# Patient Record
Sex: Female | Born: 1950 | Race: White | Hispanic: No | Smoking: Current every day smoker
Health system: Southern US, Community
[De-identification: ages and names within clinical notes are randomized; demographics above are authoritative.]

## PROBLEM LIST (undated history)

## (undated) DIAGNOSIS — J432 Centrilobular emphysema: Secondary | ICD-10-CM

## (undated) DIAGNOSIS — J449 Chronic obstructive pulmonary disease, unspecified: Secondary | ICD-10-CM

## (undated) DIAGNOSIS — I2510 Atherosclerotic heart disease of native coronary artery without angina pectoris: Secondary | ICD-10-CM

## (undated) DIAGNOSIS — F17210 Nicotine dependence, cigarettes, uncomplicated: Secondary | ICD-10-CM

## (undated) HISTORY — PX: ABDOMINAL HYSTERECTOMY: SHX81

---

## 2012-06-16 ENCOUNTER — Ambulatory Visit: Payer: Self-pay | Admitting: Internal Medicine

## 2013-02-11 ENCOUNTER — Ambulatory Visit: Payer: Self-pay | Admitting: Family Medicine

## 2013-03-16 ENCOUNTER — Ambulatory Visit: Payer: Self-pay | Admitting: Emergency Medicine

## 2013-07-11 IMAGING — CT CT CHEST W/O CM
1 of 2 series · 14 of 32 positions shown, 18 images · non-contrast
Comparison: none

REASON FOR EXAM: abn chest xray showed nodule at right lung base
COMMENTS:

PROCEDURE:     KCT - KCT CHEST WITHOUT CONTRAST  - June 16, 2012 [DATE]
RESULT:     History: Pulmonary nodule
Comparison Study: Prior recent chest x-ray from [REDACTED].

[Series 2: chest w/o 3.0 i31f 2 · axial · non-contrast · 0.63mm/px · z∈[-625,-346]mm · 14 of 111 slices shown, 18 images]
[im 9/111  mediastinal]
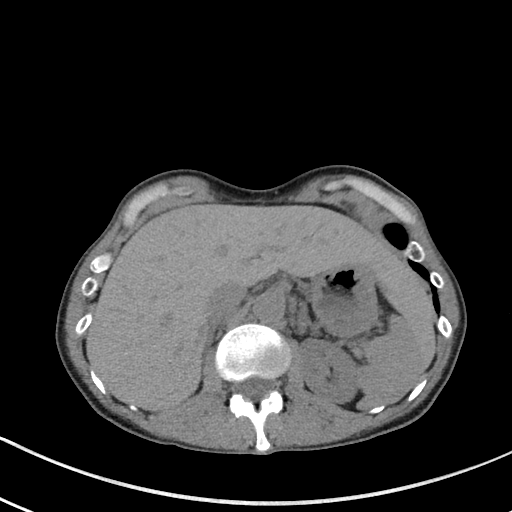
[im 9/111  lung]
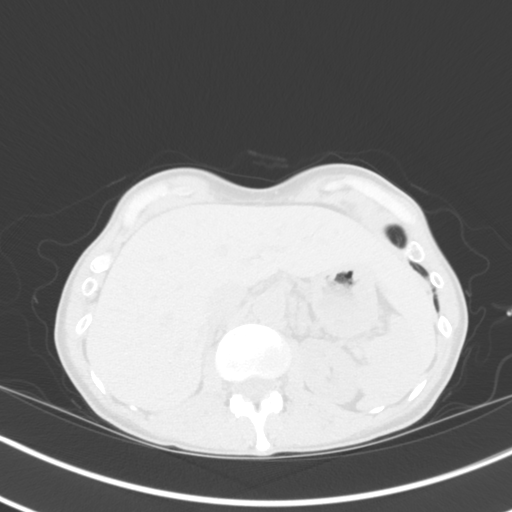
[im 17/111  lung]
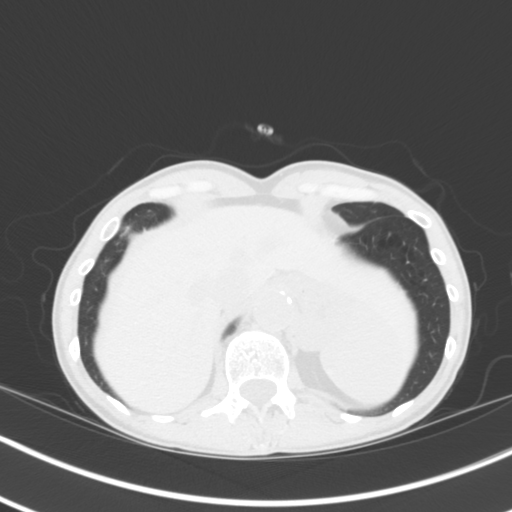
[im 26/111  lung]
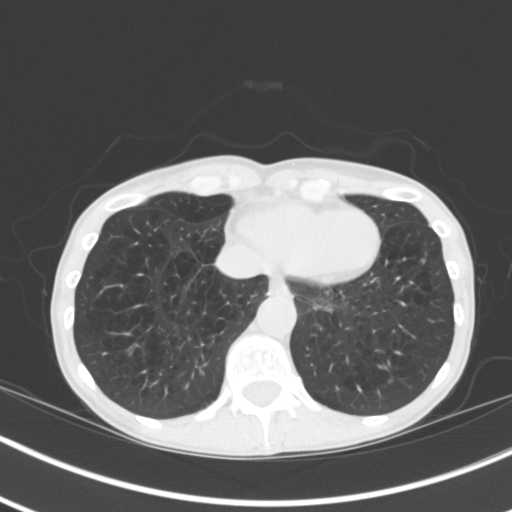
[im 34/111  lung]
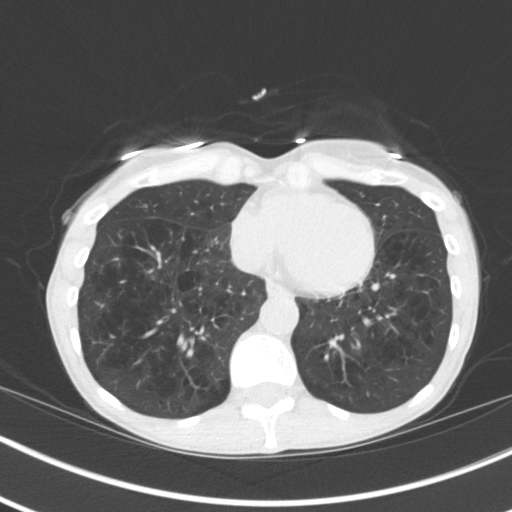
[im 43/111  mediastinal]
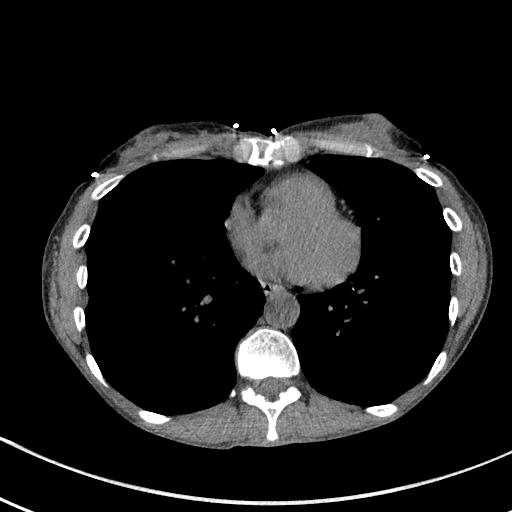
[im 43/111  lung]
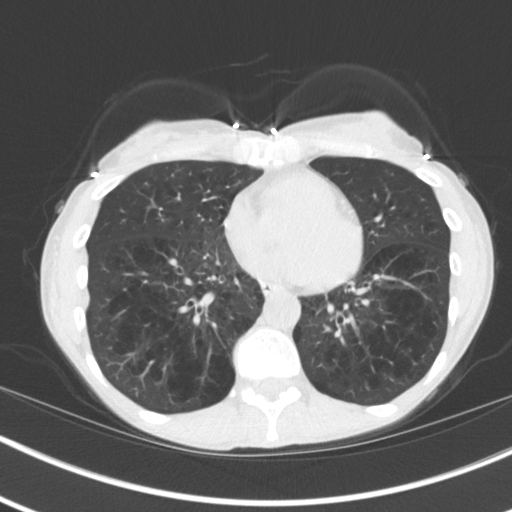
[im 51/111  lung]
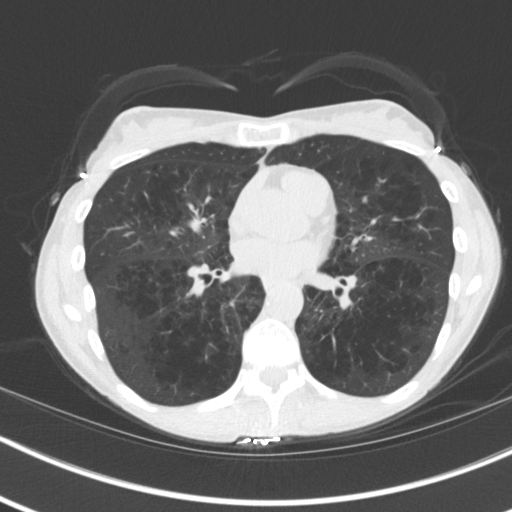
[im 53/111  lung]
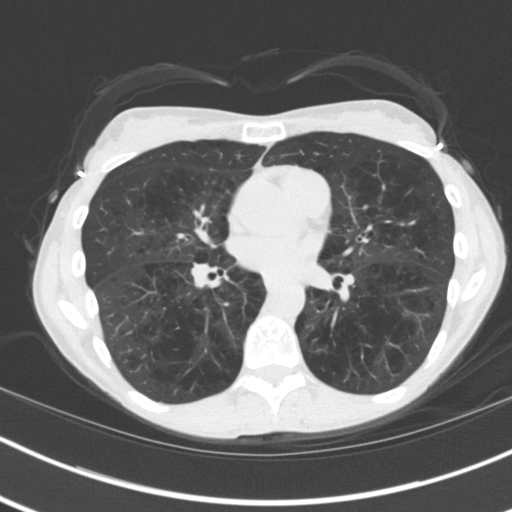
[im 56/111  lung]
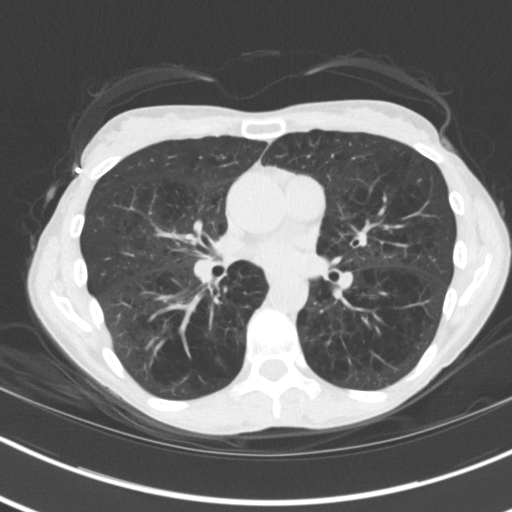
[im 60/111  mediastinal]
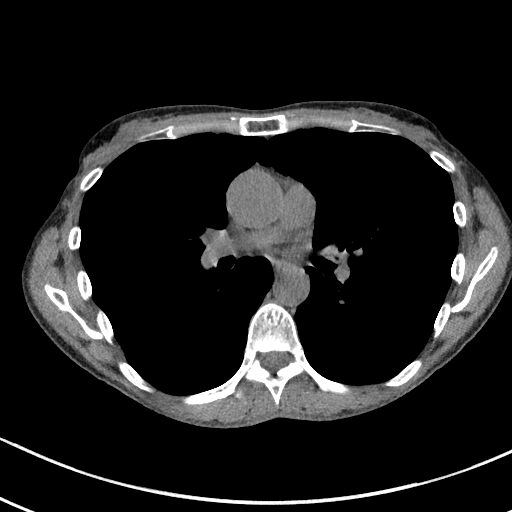
[im 60/111  lung]
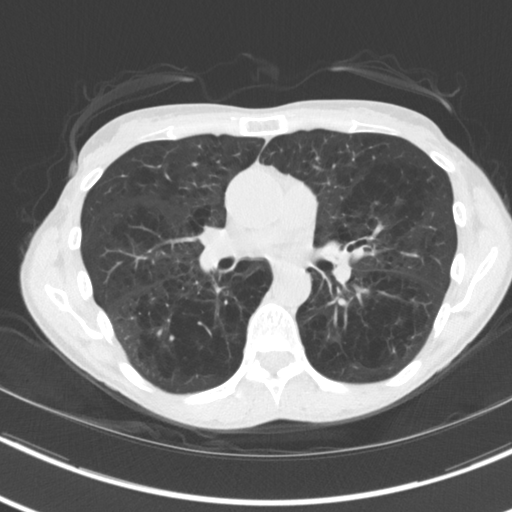
[im 68/111  lung]
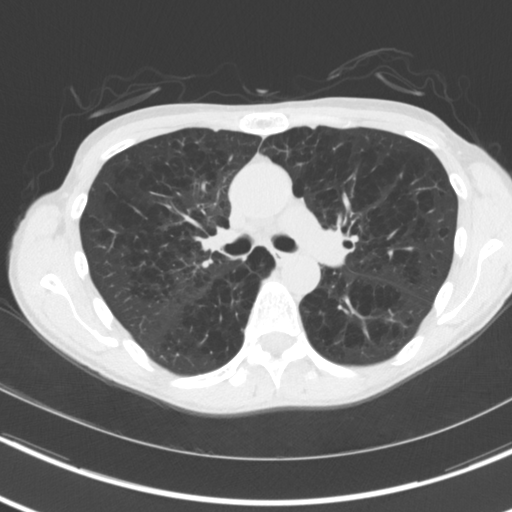
[im 77/111  lung]
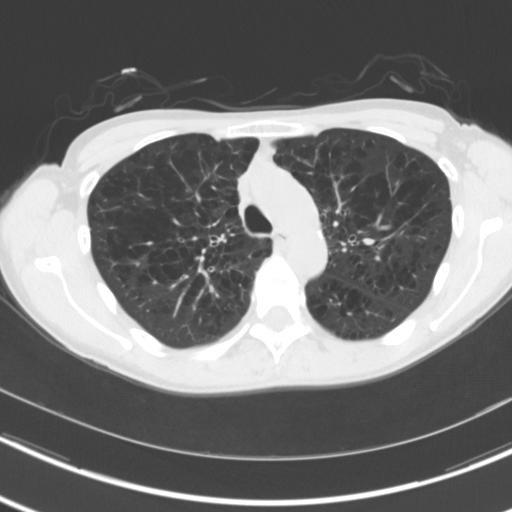
[im 85/111  lung]
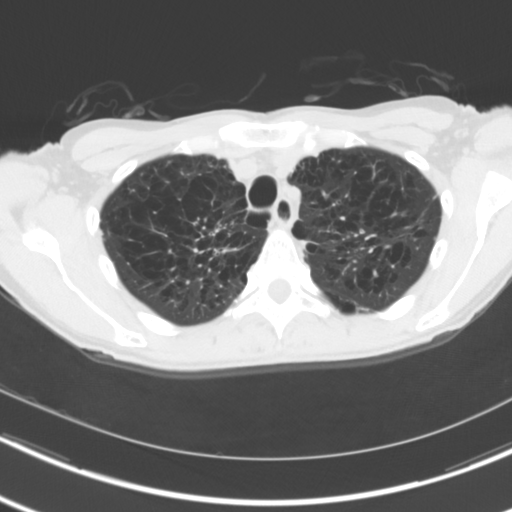
[im 94/111  mediastinal]
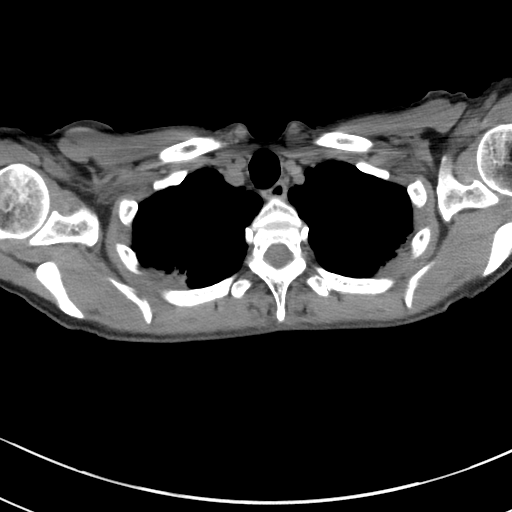
[im 94/111  lung]
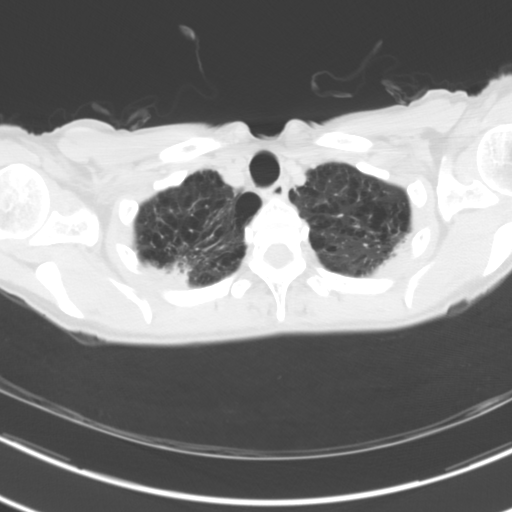
[im 102/111  lung]
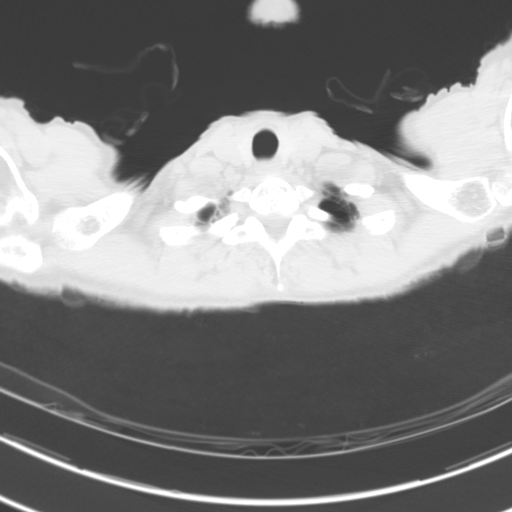

[14 of 32 positions shown; findings below may reference images not displayed]

FINDINGS: Standard nonenhanced CT obtained. Biapical prominent pleural
parenchymal thickening is noted particularly right. These changes could
represent scarring however underlying tumor or infection cannot be excluded.
 This corresponds to the abnormality noted on recent chest x-ray. Local
nonspecific 2 mm nodules are noted in the left lung on lung CAD images.
Mediastinum is unremarkable. Thoracic aorta nondistended. Adrenals are
normal. Changes of bullous COPD noted.
IMPRESSION: 1. Biapical prominent pleural parenchymal thickening right side greater than
left. These changes are most likely related to scarring, however pneumonia
or underlying tumor cannot be excluded.
2. Tiny nonspecific 2 mm nodules in the left lung seen best on lung CAD
images.

 It is suggested that a followup CT be obtained to demonstrate stability.

## 2019-04-25 ENCOUNTER — Other Ambulatory Visit: Payer: Self-pay

## 2019-04-25 ENCOUNTER — Encounter: Payer: Self-pay | Admitting: Emergency Medicine

## 2019-04-25 ENCOUNTER — Ambulatory Visit
Admission: EM | Admit: 2019-04-25 | Discharge: 2019-04-25 | Disposition: A | Discharge status: Home / Self Care | Payer: Managed Care, Other (non HMO)

## 2019-04-25 DIAGNOSIS — M5442 Lumbago with sciatica, left side: Secondary | ICD-10-CM

## 2019-04-25 MED ORDER — PREDNISONE 10 MG PO TABS: ORAL_TABLET | ORAL | 0 refills | Status: DC

## 2019-04-25 NOTE — ED Triage Notes (Signed)
Pt c/o lower back pain and radiates into her left buttock and leg. Started about a week ago but was further up her back. Denies numbness and tingling.

## 2019-04-25 NOTE — Discharge Instructions (Addendum)
Take medication as prescribed. Rest. Drink plenty of fluids. Ice.  ° °Follow up with your primary care physician this week as needed. Return to Urgent care for new or worsening concerns.  ° °
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
## 2020-02-15 LAB — EXTERNAL GENERIC LAB PROCEDURE: COLOGUARD: NEGATIVE

## 2020-02-15 LAB — COLOGUARD: COLOGUARD: NEGATIVE

## 2022-08-28 ENCOUNTER — Ambulatory Visit
Admission: EM | Admit: 2022-08-28 | Discharge: 2022-08-28 | Disposition: A | Discharge status: Home / Self Care | Payer: Medicare HMO | Attending: Family Medicine | Admitting: Family Medicine

## 2022-08-28 DIAGNOSIS — J0140 Acute pansinusitis, unspecified: Secondary | ICD-10-CM

## 2022-08-28 MED ORDER — AZITHROMYCIN 250 MG PO TABS: 250.0000 mg | ORAL_TABLET | Freq: Every day | ORAL | 0 refills | Status: AC

## 2022-08-28 MED ORDER — PREDNISONE 20 MG PO TABS: 40.0000 mg | ORAL_TABLET | Freq: Every day | ORAL | 0 refills | Status: AC

## 2022-08-28 NOTE — ED Triage Notes (Signed)
Pt c/o sinus pain & pressure, pt states she has been dealing with this intermittently since September

## 2022-08-28 NOTE — Discharge Instructions (Addendum)
Stop by the pharmacy to pick up your prescriptions.  Follow up with your primary care provider as needed.  

## 2022-09-01 NOTE — ED Provider Notes (Signed)
[facility]    CSN: [id number] Arrival date & time: 08/28/22  1404      History   Chief Complaint Chief Complaint  Patient presents with   Nasal Congestion    HPI Jodi English is a 71 y.o. female.   HPI   Jodi English presents for ongoing intermittent sinus pressure and pain since September but more recently has worsened in the last week or two.  She has not had a fever but has felt warm.  She has tried over-the-counter medications without relief.  She has history of smoking.  Has a slight cough.  That she believes is related to her ongoing nasal congestion.      History reviewed. No pertinent past medical history.  There are no problems to display for this patient.   Past Surgical History:  Procedure Laterality Date   ABDOMINAL HYSTERECTOMY      OB History   No obstetric history on file.      Home Medications    Prior to Admission medications   Medication Sig Start Date End Date Taking? Authorizing Provider  alendronate (FOSAMAX) 70 MG tablet TAKE 1 TABLET ONE TIME A WEEK AS DIRECTED 07/21/22  Yes [provider]  atorvastatin (LIPITOR) 10 MG tablet Take 1 tablet by mouth daily. 07/10/21  Yes [provider]  azithromycin (ZITHROMAX Z-PAK) 250 MG tablet Take 1 tablet (250 mg total) by mouth daily. Take 2 tablets on day 1 08/28/22  Yes Dhillon Comunale, DO  Calcium Carbonate-Vitamin D 600-400 MG-UNIT tablet Take by mouth. 08/01/06  Yes [provider]  Cholecalciferol 25 MCG (1000 UT) capsule Take by mouth. 10/01/09  Yes [provider]  cyanocobalamin (,VITAMIN B-12,) 1000 MCG/ML injection Inject into the muscle. 08/30/18  Yes [provider]  Fluticasone-Umeclidin-Vilant (TRELEGY ELLIPTA) 100-62.5-25 MCG/ACT AEPB Inhale into the lungs. 06/25/22  Yes [provider]  ibuprofen (ADVIL) 200 MG tablet Take by mouth. 10/01/09  Yes [provider]  predniSONE (DELTASONE) 20 MG tablet Take 2 tablets (40  mg total) by mouth daily for 5 days. 08/28/22 09/02/22 Yes Deshundra Waller, Seward Meth, DO  metaxalone (SKELAXIN) 800 MG tablet Take by mouth. 08/30/18   [provider]    Family History History reviewed. No pertinent family history.  Social History Social History   Tobacco Use   Smoking status: Every Day    Packs/day: 0.50    Types: Cigarettes   Smokeless tobacco: Never  Vaping Use   Vaping Use: Never used  Substance Use Topics   Alcohol use: Not Currently   Drug use: Not Currently     Allergies   Amoxicillin-pot clavulanate, Gabapentin, and Lisinopril   Review of Systems Review of Systems: negative unless otherwise stated in HPI.      Physical Exam Triage Vital Signs ED Triage Vitals  Enc Vitals Group     BP 08/28/22 1529 133/76     Pulse Rate 08/28/22 1529 72     Resp --      Temp 08/28/22 1529 98 F (36.7 C)     Temp Source 08/28/22 1529 Oral     SpO2 08/28/22 1529 98 %     Weight 08/28/22 1527 98 lb (44.5 kg)     Height 08/28/22 1527 5\' 5"  (1.651 m)     Head Circumference --      Peak Flow --      Pain Score 08/28/22 1527 5     Pain Loc --      Pain Edu? --  Excl. in [place]? --    No data found.  Updated Vital Signs BP 133/76 (BP Location: Left Arm)   Pulse 72   Temp 98 F (36.7 C) (Oral)   Ht 5\' 5"  (1.651 m)   Wt 44.5 kg   SpO2 98%   BMI 16.31 kg/m   Visual Acuity Right Eye Distance:   Left Eye Distance:   Bilateral Distance:    Right Eye Near:   Left Eye Near:    Bilateral Near:     Physical Exam GEN:     alert, non-toxic appearing female in no distress    HENT:  mucus membranes moist, oropharyngeal without lesions or exudate, no tonsillar hypertrophy or erythema, moderate erythematous edematous turbinates, clear nasal discharge, bilateral maxillary and frontal sinus tenderness to palpation right worse than left EYES:   pupils equal and reactive, no scleral injection or discharge NECK:  normal ROM, no lymphadenopathy, no meningismus    RESP:  no increased work of breathing CVS:   regular rate  Skin:   warm and dry, no rash on visible skin    UC Treatments / Results  Labs (all labs ordered are listed, but only abnormal results are displayed) Labs Reviewed - No data to display  EKG   Radiology No results found.  Procedures Procedures (including critical care time)  Medications Ordered in UC Medications - No data to display  Initial Impression / Assessment and Plan / UC Course  I have reviewed the triage vital signs and the nursing notes.  Pertinent labs & imaging results that were available during my care of the patient were reviewed by me and considered in my medical decision making (see chart for details).      Pt is a 71 y.o. female who presents for ongoing sinus pressure and congestion for the past few months however has worsened in the last week.  Jodi English is  afebrile here without recent antipyretics.  Vital signs stable.  Overall pt is  non-toxic appearing, well hydrated, without respiratory distress.  On exam, she has tenderness along the frontal and maxillary sinuses bilaterally.  COVID  and influenza testing deferred due to length of symptoms. Treat  pansinusitis with steroids and antibiotics as below. Typical duration of symptoms discussed. Return and ED precautions given and patient voiced understanding.  She is to follow-up with ENT if this is not improving after antibiotics.  Discussed MDM, treatment plan and plan for follow-up with patient who agrees with plan.  .     Final Clinical Impressions(s) / UC Diagnoses   Final diagnoses:  Acute non-recurrent pansinusitis     Discharge Instructions      Stop by the pharmacy to pick up your prescriptions.  Follow up with your primary care provider as needed.     ED Prescriptions     Medication Sig Dispense Auth. Provider   azithromycin (ZITHROMAX Z-PAK) 250 MG tablet Take 1 tablet (250 mg total) by mouth daily. Take 2 tablets on day 1 6  tablet Nieve Rojero, DO   predniSONE (DELTASONE) 20 MG tablet Take 2 tablets (40 mg total) by mouth daily for 5 days. 10 tablet Carney Bern, DO      PDMP not reviewed this encounter.   Katha Cabal, DO 09/01/22 1938

## 2023-06-03 ENCOUNTER — Other Ambulatory Visit: Payer: Self-pay | Admitting: Student

## 2023-06-03 DIAGNOSIS — D3705 Neoplasm of uncertain behavior of pharynx: Secondary | ICD-10-CM

## 2023-06-07 ENCOUNTER — Ambulatory Visit
Admission: RE | Admit: 2023-06-07 | Discharge: 2023-06-07 | Disposition: A | Discharge status: Home / Self Care | Payer: Medicare HMO | Source: Ambulatory Visit | Attending: Student | Admitting: Student

## 2023-06-07 DIAGNOSIS — D3705 Neoplasm of uncertain behavior of pharynx: Secondary | ICD-10-CM

## 2023-06-07 MED ORDER — IOPAMIDOL (ISOVUE-300) INJECTION 61%
75.0000 mL | Freq: Once | INTRAVENOUS | Status: AC | PRN
  Administered 2023-06-07: 75 mL via INTRAVENOUS

## 2024-05-17 ENCOUNTER — Other Ambulatory Visit: Payer: Self-pay

## 2024-05-17 ENCOUNTER — Encounter: Payer: Self-pay | Admitting: Ophthalmology

## 2024-05-18 ENCOUNTER — Encounter: Payer: Self-pay | Admitting: Ophthalmology

## 2024-05-18 NOTE — Anesthesia Preprocedure Evaluation (Addendum)
 Anesthesia Evaluation  Patient identified by MRN, date of birth, ID band Patient awake    Reviewed: Allergy & Precautions, H&P , NPO status , Patient's Chart, lab work & pertinent test results  Airway Mallampati: II  TM Distance: >3 FB Neck ROM: Full    Dental no notable dental hx. (+) Chipped   Pulmonary neg pulmonary ROS, COPD, Current Smoker and Patient abstained from smoking.  Breathing is deep, appears to have increased work of breathing, but patient states she is fine and I just do that sometimes. Breath sounds are clear, and patient does not think she needs a breathing treatment. Is not on oxygen at home.  Pulmonary exam normal breath sounds clear to auscultation       Cardiovascular + CAD  negative cardio ROS Normal cardiovascular exam Rhythm:Regular Rate:Normal     Neuro/Psych  PSYCHIATRIC DISORDERS      negative neurological ROS  negative psych ROS   GI/Hepatic negative GI ROS, Neg liver ROS,,,  Endo/Other  negative endocrine ROS    Renal/GU negative Renal ROS  negative genitourinary   Musculoskeletal negative musculoskeletal ROS (+)    Abdominal   Peds negative pediatric ROS (+)  Hematology negative hematology ROS (+)   Anesthesia Other Findings COPD (chronic obstructive pulmonary disease) (HCC)  CAD (coronary artery disease) COPD, severe (HCC)  Cigarette nicotine dependence Centrilobular emphysema (HCC)     Reproductive/Obstetrics negative OB ROS                              Anesthesia Physical Anesthesia Plan  ASA: 3  Anesthesia Plan: MAC   Post-op Pain Management:    Induction: Intravenous  PONV Risk Score and Plan:   Airway Management Planned: Natural Airway and Nasal Cannula  Additional Equipment:   Intra-op Plan:   Post-operative Plan:   Informed Consent: I have reviewed the patients History and Physical, chart, labs and discussed the procedure  including the risks, benefits and alternatives for the proposed anesthesia with the patient or authorized representative who has indicated his/her understanding and acceptance.     Dental Advisory Given  Plan Discussed with: Anesthesiologist, CRNA and Surgeon  Anesthesia Plan Comments: (Patient consented for risks of anesthesia including but not limited to:  - adverse reactions to medications - damage to eyes, teeth, lips or other oral mucosa - nerve damage due to positioning  - sore throat or hoarseness - Damage to heart, brain, nerves, lungs, other parts of body or loss of life  Patient voiced understanding and assent.)        Anesthesia Quick Evaluation

## 2024-05-24 NOTE — Discharge Instructions (Signed)

## 2024-05-28 ENCOUNTER — Ambulatory Visit: Payer: Self-pay | Admitting: Anesthesiology

## 2024-05-28 ENCOUNTER — Ambulatory Visit
Admission: RE | Admit: 2024-05-28 | Discharge: 2024-05-28 | Disposition: A | Discharge status: Home / Self Care | Attending: Ophthalmology | Admitting: Ophthalmology

## 2024-05-28 ENCOUNTER — Encounter
Admission: RE | Disposition: A | Discharge status: Home / Self Care | Payer: Self-pay | Source: Home / Self Care | Attending: Ophthalmology

## 2024-05-28 ENCOUNTER — Encounter: Payer: Self-pay | Admitting: Ophthalmology

## 2024-05-28 ENCOUNTER — Other Ambulatory Visit: Payer: Self-pay

## 2024-05-28 DIAGNOSIS — H25042 Posterior subcapsular polar age-related cataract, left eye: Secondary | ICD-10-CM | POA: Diagnosis present

## 2024-05-28 DIAGNOSIS — H2512 Age-related nuclear cataract, left eye: Secondary | ICD-10-CM | POA: Insufficient documentation

## 2024-05-28 DIAGNOSIS — F17210 Nicotine dependence, cigarettes, uncomplicated: Secondary | ICD-10-CM | POA: Insufficient documentation

## 2024-05-28 HISTORY — DX: Atherosclerotic heart disease of native coronary artery without angina pectoris: I25.10

## 2024-05-28 HISTORY — DX: Centrilobular emphysema: J43.2

## 2024-05-28 HISTORY — DX: Chronic obstructive pulmonary disease, unspecified: J44.9

## 2024-05-28 HISTORY — DX: Nicotine dependence, cigarettes, uncomplicated: F17.210

## 2024-05-28 HISTORY — PX: CATARACT EXTRACTION W/PHACO: SHX586

## 2024-05-28 SURGERY — PHACOEMULSIFICATION, CATARACT, WITH IOL INSERTION
Anesthesia: Monitor Anesthesia Care | Laterality: Left

## 2024-05-28 MED ORDER — TETRACAINE HCL 0.5 % OP SOLN
1.0000 [drp] | OPHTHALMIC | Status: DC | PRN
  Administered 2024-05-28 (×3): 1 [drp] via OPHTHALMIC

## 2024-05-28 MED ORDER — LIDOCAINE HCL (PF) 2 % IJ SOLN
INTRAOCULAR | Status: DC | PRN
  Administered 2024-05-28: 1 mL via INTRAOCULAR

## 2024-05-28 MED ORDER — ARMC OPHTHALMIC DILATING DROPS
OPHTHALMIC | Status: AC
Start: 2024-05-28 — End: 2024-05-28
  Filled 2024-05-28: qty 0.5

## 2024-05-28 MED ORDER — ARMC OPHTHALMIC DILATING DROPS
1.0000 | OPHTHALMIC | Status: DC | PRN
  Administered 2024-05-28 (×3): 1 via OPHTHALMIC

## 2024-05-28 MED ORDER — MIDAZOLAM HCL 2 MG/2ML IJ SOLN
INTRAMUSCULAR | Status: AC
  Filled 2024-05-28: qty 2

## 2024-05-28 MED ORDER — SIGHTPATH DOSE#1 BSS IO SOLN
INTRAOCULAR | Status: DC | PRN
  Administered 2024-05-28: 15 mL via INTRAOCULAR

## 2024-05-28 MED ORDER — FENTANYL CITRATE (PF) 100 MCG/2ML IJ SOLN
INTRAMUSCULAR | Status: AC
  Filled 2024-05-28: qty 2

## 2024-05-28 MED ORDER — MOXIFLOXACIN HCL 0.5 % OP SOLN
OPHTHALMIC | Status: DC | PRN
  Administered 2024-05-28: .2 mL via OPHTHALMIC

## 2024-05-28 MED ORDER — SIGHTPATH DOSE#1 NA HYALUR & NA CHOND-NA HYALUR IO KIT
PACK | INTRAOCULAR | Status: DC | PRN
  Administered 2024-05-28: 1 via OPHTHALMIC

## 2024-05-28 MED ORDER — SIGHTPATH DOSE#1 BSS IO SOLN
INTRAOCULAR | Status: DC | PRN
  Administered 2024-05-28: 81 mL via OPHTHALMIC

## 2024-05-28 MED ORDER — LACTATED RINGERS IV SOLN: INTRAVENOUS | Status: DC

## 2024-05-28 MED ORDER — MIDAZOLAM HCL 2 MG/2ML IJ SOLN
INTRAMUSCULAR | Status: DC | PRN
  Administered 2024-05-28: 1 mg via INTRAVENOUS

## 2024-05-28 MED ORDER — FENTANYL CITRATE (PF) 100 MCG/2ML IJ SOLN
INTRAMUSCULAR | Status: DC | PRN
  Administered 2024-05-28: 50 ug via INTRAVENOUS

## 2024-05-28 MED ORDER — TETRACAINE HCL 0.5 % OP SOLN
OPHTHALMIC | Status: AC
  Filled 2024-05-28: qty 4

## 2024-05-28 SURGICAL SUPPLY — 9 items
DISSECTOR HYDRO NUCLEUS 50X22 (MISCELLANEOUS) ×1 IMPLANT
FEE CATARACT SUITE SIGHTPATH (MISCELLANEOUS) ×1 IMPLANT
GLOVE PI ULTRA LF STRL 7.5 (GLOVE) ×1 IMPLANT
GLOVE SURG SYN 6.5 PF PI BL (GLOVE) ×1 IMPLANT
GLOVE SURG SYN 8.5 PF PI BL (GLOVE) ×1 IMPLANT
LENS IOL TECNIS EYHANCE 21.0 (Intraocular Lens) IMPLANT
NDL FILTER BLUNT 18X1 1/2 (NEEDLE) ×1 IMPLANT
NEEDLE FILTER BLUNT 18X1 1/2 (NEEDLE) ×1 IMPLANT
SYR 3ML LL SCALE MARK (SYRINGE) ×1 IMPLANT

## 2024-05-28 NOTE — Anesthesia Postprocedure Evaluation (Signed)
 Anesthesia Post Note  Patient: Jodi English  Procedure(s) Performed: PHACOEMULSIFICATION, CATARACT, WITH IOL INSERTION 3.24, 00:28.3 (Left)  Patient location during evaluation: PACU Anesthesia Type: MAC Level of consciousness: awake and alert Pain management: pain level controlled Vital Signs Assessment: post-procedure vital signs reviewed and stable Respiratory status: spontaneous breathing, nonlabored ventilation, respiratory function stable and patient connected to nasal cannula oxygen Cardiovascular status: stable and blood pressure returned to baseline Postop Assessment: no apparent nausea or vomiting Anesthetic complications: no   No notable events documented.   Last Vitals:  Vitals:   05/28/24 0958 05/28/24 1001  BP: (!) 110/59 (!) 120/57  Pulse: 75 61  Resp: 16 19  Temp: (!) 36.1 C (!) 36.1 C  SpO2: 98% 98%    Last Pain:  Vitals:   05/28/24 1001  PainSc: 0-No pain                 [name]

## 2024-05-28 NOTE — Op Note (Signed)
 OPERATIVE NOTE  Jodi English [id number] 05/28/2024   PREOPERATIVE DIAGNOSIS:  Nuclear sclerotic cataract left eye.  H25.12   POSTOPERATIVE DIAGNOSIS:    Nuclear sclerotic cataract left eye.     PROCEDURE:  Phacoemusification with posterior chamber intraocular lens placement of the left eye   LENS:   Implant Name Type Inv. Item Serial No. Manufacturer Lot No. LRB No. Used Action  LENS IOL TECNIS EYHANCE 21.0 - [id number] Intraocular Lens LENS IOL TECNIS EYHANCE 21.0 [id number] [employer]  Left 1 Implanted      Procedure(s): PHACOEMULSIFICATION, CATARACT, WITH IOL INSERTION 3.24, 00:28.3 (Left)  SURGEON:  Adine Novak, MD, MPH   ANESTHESIA:  Topical with tetracaine  drops augmented with 1% preservative-free intracameral lidocaine .  ESTIMATED BLOOD LOSS: <1 mL   COMPLICATIONS:  None.   DESCRIPTION OF PROCEDURE:  The patient was identified in the holding room and transported to the operating room and placed in the supine position under the operating microscope.  The left eye was identified as the operative eye and it was prepped and draped in the usual sterile ophthalmic fashion.   A 1.0 millimeter clear-corneal paracentesis was made at the 5:00 position. 0.5 ml of preservative-free 1% lidocaine  with epinephrine  was injected into the anterior chamber.  The anterior chamber was filled with viscoelastic.  A 2.4 millimeter keratome was used to make a near-clear corneal incision at the 2:00 position.  A curvilinear capsulorrhexis was made with a cystotome and capsulorrhexis forceps.  Balanced salt solution was used to hydrodissect and hydrodelineate the nucleus.   Phacoemulsification was then used in stop and chop fashion to remove the lens nucleus and epinucleus.  The remaining cortex was then removed using the irrigation and aspiration handpiece. Viscoelastic was then placed into the capsular bag to distend it for lens placement.  A lens was then injected into the capsular bag.  The remaining  viscoelastic was aspirated.   Wounds were hydrated with balanced salt solution.  The anterior chamber was inflated to a physiologic pressure with balanced salt solution.  Intracameral vigamox  0.1 mL undiltued was injected into the eye and a drop placed onto the ocular surface.  No wound leaks were noted.  The patient was taken to the recovery room in stable condition without complications of anesthesia or surgery  Adine Novak 05/28/2024, 9:55 AM

## 2024-05-28 NOTE — H&P (Signed)
 [facility]   Primary Care Physician:  Lauran Hails Primary Care Ophthalmologist: Dr. Adine Novak  Pre-Procedure History & Physical: HPI:  Jodi English is a 73 y.o. female here for cataract surgery.   Past Medical History:  Diagnosis Date   CAD (coronary artery disease)    Centrilobular emphysema (HCC)    Cigarette nicotine dependence    COPD (chronic obstructive pulmonary disease) (HCC)    COPD, severe (HCC)     Past Surgical History:  Procedure Laterality Date   ABDOMINAL HYSTERECTOMY      Prior to Admission medications   Medication Sig Start Date End Date Taking? Authorizing Provider  acetaminophen (TYLENOL) 325 MG tablet Take 650 mg by mouth every 6 (six) hours as needed.   Yes [provider]  alendronate (FOSAMAX) 70 MG tablet TAKE 1 TABLET ONE TIME A WEEK AS DIRECTED 07/21/22  Yes [provider]  atorvastatin (LIPITOR) 10 MG tablet Take 1 tablet by mouth daily. 07/10/21  Yes [provider]  azithromycin  (ZITHROMAX  Z-PAK) 250 MG tablet Take 1 tablet (250 mg total) by mouth daily. Take 2 tablets on day 1 08/28/22  Yes Brimage, Vondra, DO  Calcium Carbonate-Vitamin D 600-400 MG-UNIT tablet Take by mouth. 08/01/06  Yes [provider]  cetirizine (ZYRTEC) 10 MG tablet Take 10 mg by mouth daily.   Yes [provider]  cyanocobalamin (,VITAMIN B-12,) 1000 MCG/ML injection Inject into the muscle. 08/30/18  Yes [provider]  Fluticasone-Salmeterol (WIXELA INHUB IN) Inhale into the lungs.   Yes [provider]  ibuprofen (ADVIL) 200 MG tablet Take by mouth. 10/01/09  Yes [provider]  metaxalone (SKELAXIN) 800 MG tablet Take by mouth. 08/30/18  Yes [provider]  Multiple Vitamin (MULTIVITAMIN) LIQD Take 5 mLs by mouth daily.   Yes [provider]  Omega-3 Fatty Acids (FISH OIL) 300 MG CAPS Take by mouth.   Yes [provider]  Cholecalciferol 25 MCG (1000 UT) capsule  Take by mouth. Patient not taking: Reported on 05/17/2024 10/01/09   [provider]  Fluticasone-Umeclidin-Vilant (TRELEGY ELLIPTA) 100-62.5-25 MCG/ACT AEPB Inhale into the lungs. Patient not taking: Reported on 05/17/2024 06/25/22   [provider]    Allergies as of 05/08/2024 - Review Complete 08/28/2022  Allergen Reaction Noted   Amoxicillin-pot clavulanate Other (See Comments) 06/04/2013   Gabapentin  04/25/2019   Lisinopril Nausea And Vomiting 12/21/2021    History reviewed. No pertinent family history.  Social History   Socioeconomic History   Marital status: Widowed    Spouse name: Not on file   Number of children: Not on file   Years of education: Not on file   Highest education level: Not on file  Occupational History   Not on file  Tobacco Use   Smoking status: Every Day    Current packs/day: 0.50    Types: Cigarettes   Smokeless tobacco: Never  Vaping Use   Vaping status: Never Used  Substance and Sexual Activity   Alcohol use: Not Currently   Drug use: Not Currently   Sexual activity: Not on file  Other Topics Concern   Not on file  Social History Narrative   Not on file   Social Drivers of Health   Financial Resource Strain: Low Risk  (05/27/2024)   Received from [facility]   Overall Financial Resource Strain (CARDIA)    Difficulty of Paying Living Expenses: Not hard at all  Food Insecurity: No Food Insecurity (05/27/2024)  Received from [facility]   Hunger Vital Sign    Within the past 12 months, you worried that your food would run out before you got the money to buy more.: Never true    Within the past 12 months, the food you bought just didn't last and you didn't have money to get more.: Never true  Transportation Needs: No Transportation Needs (05/27/2024)   Received from [facility] - Transportation    In the past 12 months, has lack of transportation kept you from  medical appointments or from getting medications?: No    Lack of Transportation (Non-Medical): No  Physical Activity: Not on file  Stress: Not on file  Social Connections: Not on file  Intimate Partner Violence: Not on file    Review of Systems: See HPI, otherwise negative ROS  Physical Exam: Ht 5' 5 (1.651 m)   Wt 45.4 kg   BMI 16.64 kg/m  General:   Alert, cooperative. Head:  Normocephalic and atraumatic. Respiratory:  Normal work of breathing. Cardiovascular:  NAD  Impression/Plan: Jodi English is here for cataract surgery.  Risks, benefits, limitations, and alternatives regarding cataract surgery have been reviewed with the patient.  Questions have been answered.  All parties agreeable.   Adine Novak, MD  05/28/2024, 9:27 AM

## 2024-05-28 NOTE — Transfer of Care (Signed)
 Immediate Anesthesia Transfer of Care Note  Patient: Jodi English  Procedure(s) Performed: PHACOEMULSIFICATION, CATARACT, WITH IOL INSERTION 3.24, 00:28.3 (Left)  Patient Location: PACU  Anesthesia Type: MAC  Level of Consciousness: awake, alert  and patient cooperative  Airway and Oxygen Therapy: Patient Spontanous Breathing and Patient connected to supplemental oxygen  Post-op Assessment: Post-op Vital signs reviewed, Patient's Cardiovascular Status Stable, Respiratory Function Stable, Patent Airway and No signs of Nausea or vomiting  Post-op Vital Signs: Reviewed and stable  Complications: No notable events documented.

## 2024-05-31 ENCOUNTER — Encounter: Payer: Self-pay | Admitting: Ophthalmology

## 2024-05-31 NOTE — Anesthesia Preprocedure Evaluation (Addendum)
 Anesthesia Evaluation  Patient identified by MRN, date of birth, ID band Patient awake    Reviewed: Allergy & Precautions, H&P , NPO status , Patient's Chart, lab work & pertinent test results  Airway Mallampati: II  TM Distance: >3 FB Neck ROM: Full    Dental no notable dental hx. (+) Chipped   Pulmonary neg pulmonary ROS, COPD, Current Smoker and Patient abstained from smoking.   Pulmonary exam normal breath sounds clear to auscultation       Cardiovascular + CAD  negative cardio ROS Normal cardiovascular exam Rhythm:Regular Rate:Normal     Neuro/Psych  PSYCHIATRIC DISORDERS      negative neurological ROS  negative psych ROS   GI/Hepatic negative GI ROS, Neg liver ROS,,,  Endo/Other  negative endocrine ROS    Renal/GU negative Renal ROS  negative genitourinary   Musculoskeletal negative musculoskeletal ROS (+)    Abdominal   Peds negative pediatric ROS (+)  Hematology negative hematology ROS (+)   Anesthesia Other Findings Previous cataract surgery 05-28-24 Dr. Ola   COPD (chronic obstructive pulmonary disease) (HCC)        CAD (coronary artery disease) COPD, severe (HCC)  Cigarette nicotine dependence Centrilobular emphysema (HCC)         Reproductive/Obstetrics negative OB ROS                              Anesthesia Physical Anesthesia Plan  ASA: 3  Anesthesia Plan: MAC   Post-op Pain Management:    Induction: Intravenous  PONV Risk Score and Plan:   Airway Management Planned: Natural Airway and Nasal Cannula  Additional Equipment:   Intra-op Plan:   Post-operative Plan:   Informed Consent: I have reviewed the patients History and Physical, chart, labs and discussed the procedure including the risks, benefits and alternatives for the proposed anesthesia with the patient or authorized representative who has indicated his/her understanding and acceptance.      Dental Advisory Given  Plan Discussed with: Anesthesiologist, CRNA and Surgeon  Anesthesia Plan Comments: (Patient consented for risks of anesthesia including but not limited to:  - adverse reactions to medications - damage to eyes, teeth, lips or other oral mucosa - nerve damage due to positioning  - sore throat or hoarseness - Damage to heart, brain, nerves, lungs, other parts of body or loss of life  Patient voiced understanding and assent.)         Anesthesia Quick Evaluation

## 2024-06-07 NOTE — Discharge Instructions (Signed)

## 2024-06-11 ENCOUNTER — Ambulatory Visit: Payer: Self-pay | Admitting: Anesthesiology

## 2024-06-11 ENCOUNTER — Other Ambulatory Visit: Payer: Self-pay

## 2024-06-11 ENCOUNTER — Encounter
Admission: RE | Disposition: A | Discharge status: Home / Self Care | Payer: Self-pay | Source: Home / Self Care | Attending: Ophthalmology

## 2024-06-11 ENCOUNTER — Ambulatory Visit
Admission: RE | Admit: 2024-06-11 | Discharge: 2024-06-11 | Disposition: A | Discharge status: Home / Self Care | Attending: Ophthalmology | Admitting: Ophthalmology

## 2024-06-11 ENCOUNTER — Encounter: Payer: Self-pay | Admitting: Ophthalmology

## 2024-06-11 DIAGNOSIS — H2511 Age-related nuclear cataract, right eye: Secondary | ICD-10-CM | POA: Diagnosis present

## 2024-06-11 DIAGNOSIS — I2510 Atherosclerotic heart disease of native coronary artery without angina pectoris: Secondary | ICD-10-CM | POA: Diagnosis not present

## 2024-06-11 DIAGNOSIS — F17210 Nicotine dependence, cigarettes, uncomplicated: Secondary | ICD-10-CM | POA: Insufficient documentation

## 2024-06-11 HISTORY — PX: CATARACT EXTRACTION W/PHACO: SHX586

## 2024-06-11 SURGERY — PHACOEMULSIFICATION, CATARACT, WITH IOL INSERTION
Anesthesia: Monitor Anesthesia Care | Laterality: Right

## 2024-06-11 MED ORDER — EPHEDRINE SULFATE (PRESSORS) 50 MG/ML IJ SOLN
INTRAMUSCULAR | Status: DC | PRN
Start: 2024-06-11 — End: 2024-06-11
  Administered 2024-06-11: 5 mg via INTRAVENOUS

## 2024-06-11 MED ORDER — EPHEDRINE 5 MG/ML INJ
INTRAVENOUS | Status: AC
  Filled 2024-06-11: qty 5

## 2024-06-11 MED ORDER — SIGHTPATH DOSE#1 BSS IO SOLN
INTRAOCULAR | Status: DC | PRN
  Administered 2024-06-11: 88 mL via OPHTHALMIC

## 2024-06-11 MED ORDER — TETRACAINE HCL 0.5 % OP SOLN
OPHTHALMIC | Status: AC
  Filled 2024-06-11: qty 4

## 2024-06-11 MED ORDER — MIDAZOLAM HCL 2 MG/2ML IJ SOLN
INTRAMUSCULAR | Status: AC
  Filled 2024-06-11: qty 2

## 2024-06-11 MED ORDER — LACTATED RINGERS IV SOLN: INTRAVENOUS | Status: DC

## 2024-06-11 MED ORDER — ARMC OPHTHALMIC DILATING DROPS
OPHTHALMIC | Status: AC
  Filled 2024-06-11: qty 0.5

## 2024-06-11 MED ORDER — ARMC OPHTHALMIC DILATING DROPS
1.0000 | OPHTHALMIC | Status: DC | PRN
  Administered 2024-06-11 (×3): 1 via OPHTHALMIC

## 2024-06-11 MED ORDER — SIGHTPATH DOSE#1 BSS IO SOLN
INTRAOCULAR | Status: DC | PRN
  Administered 2024-06-11: 15 mL via INTRAOCULAR

## 2024-06-11 MED ORDER — MIDAZOLAM HCL 2 MG/2ML IJ SOLN
INTRAMUSCULAR | Status: DC | PRN
  Administered 2024-06-11: 2 mg via INTRAVENOUS

## 2024-06-11 MED ORDER — FENTANYL CITRATE (PF) 100 MCG/2ML IJ SOLN
INTRAMUSCULAR | Status: DC | PRN
  Administered 2024-06-11: 50 ug via INTRAVENOUS

## 2024-06-11 MED ORDER — FENTANYL CITRATE (PF) 100 MCG/2ML IJ SOLN
INTRAMUSCULAR | Status: AC
  Filled 2024-06-11: qty 2

## 2024-06-11 MED ORDER — LIDOCAINE HCL (PF) 2 % IJ SOLN
INTRAOCULAR | Status: DC | PRN
  Administered 2024-06-11: 4 mL via INTRAOCULAR

## 2024-06-11 MED ORDER — SIGHTPATH DOSE#1 NA HYALUR & NA CHOND-NA HYALUR IO KIT
PACK | INTRAOCULAR | Status: DC | PRN
  Administered 2024-06-11: 1 via OPHTHALMIC

## 2024-06-11 MED ORDER — MOXIFLOXACIN HCL 0.5 % OP SOLN
OPHTHALMIC | Status: DC | PRN
  Administered 2024-06-11: .2 mL via OPHTHALMIC

## 2024-06-11 MED ORDER — TETRACAINE HCL 0.5 % OP SOLN
1.0000 [drp] | OPHTHALMIC | Status: DC | PRN
  Administered 2024-06-11 (×3): 1 [drp] via OPHTHALMIC

## 2024-06-11 SURGICAL SUPPLY — 9 items
DISSECTOR HYDRO NUCLEUS 50X22 (MISCELLANEOUS) ×1 IMPLANT
FEE CATARACT SUITE SIGHTPATH (MISCELLANEOUS) ×1 IMPLANT
GLOVE PI ULTRA LF STRL 7.5 (GLOVE) ×1 IMPLANT
GLOVE SURG SYN 6.5 PF PI BL (GLOVE) ×1 IMPLANT
GLOVE SURG SYN 8.5 PF PI BL (GLOVE) ×1 IMPLANT
LENS IOL TECNIS EYHANCE 21.0 (Intraocular Lens) IMPLANT
NDL FILTER BLUNT 18X1 1/2 (NEEDLE) ×1 IMPLANT
NEEDLE FILTER BLUNT 18X1 1/2 (NEEDLE) ×1 IMPLANT
SYR 3ML LL SCALE MARK (SYRINGE) ×1 IMPLANT

## 2024-06-11 NOTE — Transfer of Care (Signed)
 Immediate Anesthesia Transfer of Care Note  Patient: Jodi English  Procedure(s) Performed: PHACOEMULSIFICATION, CATARACT, WITH IOL INSERTION 3.66, 00:28.6 (Right)  Patient Location: PACU  Anesthesia Type: MAC  Level of Consciousness: awake, alert  and patient cooperative  Airway and Oxygen Therapy: Patient Spontanous Breathing   Post-op Assessment: Post-op Vital signs reviewed, Patient's Cardiovascular Status Stable, Respiratory Function Stable, Patent Airway and No signs of Nausea or vomiting  Post-op Vital Signs: Reviewed and stable  Complications: No notable events documented.

## 2024-06-11 NOTE — Anesthesia Postprocedure Evaluation (Signed)
 Anesthesia Post Note  Patient: CASSONDRA STACHOWSKI  Procedure(s) Performed: PHACOEMULSIFICATION, CATARACT, WITH IOL INSERTION 3.66, 00:28.6 (Right)  Patient location during evaluation: PACU Anesthesia Type: MAC Level of consciousness: awake and alert Pain management: pain level controlled Vital Signs Assessment: post-procedure vital signs reviewed and stable Respiratory status: spontaneous breathing, nonlabored ventilation, respiratory function stable and patient connected to nasal cannula oxygen Cardiovascular status: stable and blood pressure returned to baseline Postop Assessment: no apparent nausea or vomiting Anesthetic complications: no   No notable events documented.   Last Vitals:  Vitals:   06/11/24 1241 06/11/24 1245  BP: (!) 91/52 (!) 107/46  Pulse: 83 76  Resp: (!) 27 (!) 24  Temp: 36.8 C 36.8 C  SpO2: 98% 97%    Last Pain:  Vitals:   06/11/24 1245  TempSrc:   PainSc: 0-No pain                 Krishna Heuer C [place]

## 2024-06-11 NOTE — H&P (Signed)
 [facility]   Primary Care Physician:  Lauran Hails Primary Care Ophthalmologist: Dr. Adine Novak  Pre-Procedure History & Physical: HPI:  Jodi English is a 73 y.o. female here for cataract surgery.   Past Medical History:  Diagnosis Date   CAD (coronary artery disease)    Centrilobular emphysema (HCC)    Cigarette nicotine dependence    COPD (chronic obstructive pulmonary disease) (HCC)    COPD, severe (HCC)     Past Surgical History:  Procedure Laterality Date   ABDOMINAL HYSTERECTOMY     CATARACT EXTRACTION W/PHACO Left 05/28/2024   Procedure: PHACOEMULSIFICATION, CATARACT, WITH IOL INSERTION 3.24, 00:28.3;  Surgeon: Novak Adine Anes, MD;  Location: [facility];  Service: Ophthalmology;  Laterality: Left;    Prior to Admission medications   Medication Sig Start Date End Date Taking? Authorizing Provider  acetaminophen (TYLENOL) 325 MG tablet Take 650 mg by mouth every 6 (six) hours as needed.   Yes [provider]  alendronate (FOSAMAX) 70 MG tablet TAKE 1 TABLET ONE TIME A WEEK AS DIRECTED 07/21/22  Yes [provider]  atorvastatin (LIPITOR) 10 MG tablet Take 1 tablet by mouth daily. 07/10/21  Yes [provider]  azithromycin  (ZITHROMAX  Z-PAK) 250 MG tablet Take 1 tablet (250 mg total) by mouth daily. Take 2 tablets on day 1 08/28/22  Yes Brimage, Vondra, DO  Calcium Carbonate-Vitamin D 600-400 MG-UNIT tablet Take by mouth. 08/01/06  Yes [provider]  cetirizine (ZYRTEC) 10 MG tablet Take 10 mg by mouth daily.   Yes [provider]  cyanocobalamin (,VITAMIN B-12,) 1000 MCG/ML injection Inject into the muscle. 08/30/18  Yes [provider]  Fluticasone-Salmeterol (WIXELA INHUB IN) Inhale into the lungs.   Yes [provider]  ibuprofen (ADVIL) 200 MG tablet Take by mouth. 10/01/09  Yes [provider]  metaxalone (SKELAXIN) 800 MG tablet Take by mouth. 08/30/18  Yes [provider]  Multiple Vitamin (MULTIVITAMIN) LIQD Take 5 mLs by mouth daily.   Yes [provider]  Omega-3 Fatty Acids (FISH OIL) 300 MG CAPS Take by mouth.   Yes [provider]  Cholecalciferol 25 MCG (1000 UT) capsule Take by mouth. Patient not taking: Reported on 05/17/2024 10/01/09   [provider]  Fluticasone-Umeclidin-Vilant (TRELEGY ELLIPTA) 100-62.5-25 MCG/ACT AEPB Inhale into the lungs. Patient not taking: Reported on 05/17/2024 06/25/22   [provider]    Allergies as of 05/08/2024 - Review Complete 08/28/2022  Allergen Reaction Noted   Amoxicillin-pot clavulanate Other (See Comments) 06/04/2013   Gabapentin  04/25/2019   Lisinopril Nausea And Vomiting 12/21/2021    History reviewed. No pertinent family history.  Social History   Socioeconomic History   Marital status: Widowed    Spouse name: Not on file   Number of children: Not on file   Years of education: Not on file   Highest education level: Not on file  Occupational History   Not on file  Tobacco Use   Smoking status: Every Day    Current packs/day: 0.50    Types: Cigarettes   Smokeless tobacco: Never  Vaping Use   Vaping status: Never Used  Substance and Sexual Activity   Alcohol use: Not Currently   Drug use: Not Currently   Sexual activity: Not on file  Other Topics Concern   Not on file  Social History Narrative   Not on file   Social Drivers of Health   Financial Resource Strain: Low Risk  (05/27/2024)  Received from [facility]   Overall Financial Resource Strain (CARDIA)    Difficulty of Paying Living Expenses: Not hard at all  Food Insecurity: No Food Insecurity (05/27/2024)   Received from [facility]   Hunger Vital Sign    Within the past 12 months, you worried that your food would run out before you got the money to buy more.: Never true    Within the past 12 months, the food you bought just didn't last and you didn't have  money to get more.: Never true  Transportation Needs: No Transportation Needs (05/27/2024)   Received from [facility] - Transportation    In the past 12 months, has lack of transportation kept you from medical appointments or from getting medications?: No    Lack of Transportation (Non-Medical): No  Physical Activity: Not on file  Stress: Not on file  Social Connections: Not on file  Intimate Partner Violence: Not on file    Review of Systems: See HPI, otherwise negative ROS  Physical Exam: BP (!) 134/52   Temp 97.8 F (36.6 C) (Temporal)   Resp 19   Ht 5' 5 (1.651 m)   Wt 43.5 kg   SpO2 98%   BMI 15.96 kg/m  General:   Alert, cooperative. Head:  Normocephalic and atraumatic. Respiratory:  Normal work of breathing. Cardiovascular:  NAD  Impression/Plan: Jodi English is here for cataract surgery.  Risks, benefits, limitations, and alternatives regarding cataract surgery have been reviewed with the patient.  Questions have been answered.  All parties agreeable.   Adine Novak, MD  06/11/2024, 12:12 PM

## 2024-06-11 NOTE — Op Note (Signed)
 OPERATIVE NOTE  Jodi English [id number] 06/11/2024   PREOPERATIVE DIAGNOSIS:  Nuclear sclerotic cataract right eye.  H25.11   POSTOPERATIVE DIAGNOSIS:    Nuclear sclerotic cataract right eye.     PROCEDURE:  Phacoemusification with posterior chamber intraocular lens placement of the right eye   LENS:   Implant Name Type Inv. Item Serial No. Manufacturer Lot No. LRB No. Used Action  LENS IOL TECNIS EYHANCE 21.0 - [id number] Intraocular Lens LENS IOL TECNIS EYHANCE 21.0 [id number] [employer]  Right 1 Implanted       Procedure(s): PHACOEMULSIFICATION, CATARACT, WITH IOL INSERTION 3.66, 00:28.6 (Right)  SURGEON:  Adine Novak, MD, MPH  ANESTHESIOLOGIST: Anesthesiologist: Ola Donny BROCKS, MD CRNA: Veronica Alm BROCKS, CRNA   ANESTHESIA:  Topical with tetracaine  drops augmented with 1% preservative-free intracameral lidocaine .  ESTIMATED BLOOD LOSS: less than 1 mL.   COMPLICATIONS:  None.   DESCRIPTION OF PROCEDURE:  The patient was identified in the holding room and transported to the operating room and placed in the supine position under the operating microscope.  The right eye was identified as the operative eye and it was prepped and draped in the usual sterile ophthalmic fashion.   A 1.0 millimeter clear-corneal paracentesis was made at the 10:30 position. 0.5 ml of preservative-free 1% lidocaine  with epinephrine  was injected into the anterior chamber.  The anterior chamber was filled with viscoelastic.  A 2.4 millimeter keratome was used to make a near-clear corneal incision at the 8:00 position.  A curvilinear capsulorrhexis was made with a cystotome and capsulorrhexis forceps.  Balanced salt solution was used to hydrodissect and hydrodelineate the nucleus.   Phacoemulsification was then used in stop and chop fashion to remove the lens nucleus and epinucleus.  The remaining cortex was then removed using the irrigation and aspiration handpiece. Viscoelastic was then placed into the  capsular bag to distend it for lens placement.  A lens was then injected into the capsular bag.  The remaining viscoelastic was aspirated.   Wounds were hydrated with balanced salt solution.  The anterior chamber was inflated to a physiologic pressure with balanced salt solution.   Intracameral vigamox  0.1 mL undiluted was injected into the eye and a drop placed onto the ocular surface.  No wound leaks were noted.  The patient was taken to the recovery room in stable condition without complications of anesthesia or surgery  Adine Novak 06/11/2024, 12:40 PM
